# Patient Record
Sex: Male | Born: 1935 | Race: White | Hispanic: No | Marital: Married
Health system: Southern US, Community
[De-identification: ages and names within clinical notes are randomized; demographics above are authoritative.]

---

## 2013-08-06 ENCOUNTER — Inpatient Hospital Stay
Admission: AD | Admit: 2013-08-06 | Discharge: 2013-08-22 | Disposition: E | Payer: Medicare Other | Source: Ambulatory Visit | Attending: Internal Medicine | Admitting: Internal Medicine

## 2013-08-06 DIAGNOSIS — I429 Cardiomyopathy, unspecified: Secondary | ICD-10-CM

## 2013-08-06 DIAGNOSIS — L03115 Cellulitis of right lower limb: Secondary | ICD-10-CM

## 2013-08-06 DIAGNOSIS — R188 Other ascites: Secondary | ICD-10-CM

## 2013-08-06 DIAGNOSIS — E039 Hypothyroidism, unspecified: Secondary | ICD-10-CM

## 2013-08-06 DIAGNOSIS — E119 Type 2 diabetes mellitus without complications: Secondary | ICD-10-CM

## 2013-08-06 DIAGNOSIS — I10 Essential (primary) hypertension: Secondary | ICD-10-CM

## 2013-08-06 DIAGNOSIS — M109 Gout, unspecified: Secondary | ICD-10-CM

## 2013-08-06 DIAGNOSIS — I5082 Biventricular heart failure: Secondary | ICD-10-CM

## 2013-08-06 DIAGNOSIS — Z96659 Presence of unspecified artificial knee joint: Secondary | ICD-10-CM

## 2013-08-06 DIAGNOSIS — J9600 Acute respiratory failure, unspecified whether with hypoxia or hypercapnia: Secondary | ICD-10-CM

## 2013-08-06 DIAGNOSIS — N179 Acute kidney failure, unspecified: Secondary | ICD-10-CM

## 2013-08-06 LAB — CBC WITH DIFFERENTIAL/PLATELET
BASOS ABS: 0 10*3/uL (ref 0.0–0.1)
Basophils Relative: 0 % (ref 0–1)
Eosinophils Absolute: 0.4 10*3/uL (ref 0.0–0.7)
Eosinophils Relative: 1 % (ref 0–5)
HCT: 48.6 % (ref 39.0–52.0)
Hemoglobin: 14.7 g/dL (ref 13.0–17.0)
LYMPHS ABS: 0.7 10*3/uL (ref 0.7–4.0)
Lymphocytes Relative: 2 % — ABNORMAL LOW (ref 12–46)
MCH: 28.8 pg (ref 26.0–34.0)
MCHC: 30.2 g/dL (ref 30.0–36.0)
MCV: 95.3 fL (ref 78.0–100.0)
MONOS PCT: 1 % — AB (ref 3–12)
Monocytes Absolute: 0.4 10*3/uL (ref 0.1–1.0)
Neutro Abs: 33.5 10*3/uL — ABNORMAL HIGH (ref 1.7–7.7)
Neutrophils Relative %: 96 % — ABNORMAL HIGH (ref 43–77)
PLATELETS: 340 10*3/uL (ref 150–400)
RBC: 5.1 MIL/uL (ref 4.22–5.81)
RDW: 19.3 % — AB (ref 11.5–15.5)
WBC: 35 10*3/uL — ABNORMAL HIGH (ref 4.0–10.5)

## 2013-08-06 LAB — COMPREHENSIVE METABOLIC PANEL
ALBUMIN: 3 g/dL — AB (ref 3.5–5.2)
ALT: 5 U/L (ref 0–53)
AST: 9 U/L (ref 0–37)
Alkaline Phosphatase: 126 U/L — ABNORMAL HIGH (ref 39–117)
BILIRUBIN TOTAL: 0.9 mg/dL (ref 0.3–1.2)
BUN: 72 mg/dL — AB (ref 6–23)
CO2: 26 meq/L (ref 19–32)
CREATININE: 2.17 mg/dL — AB (ref 0.50–1.35)
Calcium: 8.7 mg/dL (ref 8.4–10.5)
Chloride: 106 mEq/L (ref 96–112)
GFR calc Af Amer: 30 mL/min — ABNORMAL LOW (ref >90)
GFR calc non Af Amer: 26 mL/min — ABNORMAL LOW (ref >90)
Glucose, Bld: 167 mg/dL — ABNORMAL HIGH (ref 70–99)
Potassium: 5 mEq/L (ref 3.7–5.3)
Sodium: 146 mEq/L (ref 137–147)
Total Protein: 6.1 g/dL (ref 6.0–8.3)

## 2013-08-06 LAB — BLOOD GAS, ARTERIAL
ACID-BASE EXCESS: 0.8 mmol/L (ref 0.0–2.0)
Bicarbonate: 25.9 mEq/L — ABNORMAL HIGH (ref 20.0–24.0)
FIO2: 1 %
O2 SAT: 86.9 %
PATIENT TEMPERATURE: 97.2
TCO2: 27.5 mmol/L (ref 0–100)
pCO2 arterial: 48.2 mmHg — ABNORMAL HIGH (ref 35.0–45.0)
pH, Arterial: 7.346 — ABNORMAL LOW (ref 7.350–7.450)
pO2, Arterial: 50.1 mmHg — ABNORMAL LOW (ref 80.0–100.0)

## 2013-08-07 ENCOUNTER — Other Ambulatory Visit (HOSPITAL_COMMUNITY): Payer: Self-pay

## 2013-08-07 LAB — BLOOD GAS, ARTERIAL
ACID-BASE DEFICIT: 0.1 mmol/L (ref 0.0–2.0)
Acid-base deficit: 0.4 mmol/L (ref 0.0–2.0)
Bicarbonate: 25 mEq/L — ABNORMAL HIGH (ref 20.0–24.0)
Bicarbonate: 25 mEq/L — ABNORMAL HIGH (ref 20.0–24.0)
Delivery systems: POSITIVE
Delivery systems: POSITIVE
EXPIRATORY PAP: 8
Expiratory PAP: 10
FIO2: 1 %
FIO2: 1 %
Inspiratory PAP: 16
Inspiratory PAP: 20
O2 Saturation: 84.4 %
O2 Saturation: 90.4 %
PATIENT TEMPERATURE: 96.3
PH ART: 7.352 (ref 7.350–7.450)
PO2 ART: 53.9 mmHg — AB (ref 80.0–100.0)
Patient temperature: 96.3
TCO2: 26.5 mmol/L (ref 0–100)
TCO2: 26.5 mmol/L (ref 0–100)
pCO2 arterial: 47.6 mmHg — ABNORMAL HIGH (ref 35.0–45.0)
pCO2 arterial: 45.5 mmHg — ABNORMAL HIGH (ref 35.0–45.0)
pH, Arterial: 7.332 — ABNORMAL LOW (ref 7.350–7.450)
pO2, Arterial: 46.9 mmHg — ABNORMAL LOW (ref 80.0–100.0)

## 2013-08-07 LAB — COMPREHENSIVE METABOLIC PANEL
ALBUMIN: 3 g/dL — AB (ref 3.5–5.2)
ALK PHOS: 135 U/L — AB (ref 39–117)
AST: 10 U/L (ref 0–37)
BILIRUBIN TOTAL: 1 mg/dL (ref 0.3–1.2)
BUN: 69 mg/dL — ABNORMAL HIGH (ref 6–23)
CO2: 24 meq/L (ref 19–32)
Calcium: 8.8 mg/dL (ref 8.4–10.5)
Chloride: 108 mEq/L (ref 96–112)
Creatinine, Ser: 2.14 mg/dL — ABNORMAL HIGH (ref 0.50–1.35)
GFR calc Af Amer: 30 mL/min — ABNORMAL LOW (ref >90)
GFR, EST NON AFRICAN AMERICAN: 26 mL/min — AB (ref >90)
Glucose, Bld: 149 mg/dL — ABNORMAL HIGH (ref 70–99)
POTASSIUM: 5.5 meq/L — AB (ref 3.7–5.3)
Sodium: 147 mEq/L (ref 137–147)
Total Protein: 6.2 g/dL (ref 6.0–8.3)

## 2013-08-07 LAB — FOLATE RBC: RBC Folate: 624 ng/mL — ABNORMAL HIGH (ref >280)

## 2013-08-07 LAB — HEMOGLOBIN A1C
Hgb A1c MFr Bld: 6 % — ABNORMAL HIGH (ref <5.7)
Mean Plasma Glucose: 126 mg/dL — ABNORMAL HIGH (ref <117)

## 2013-08-07 LAB — CK TOTAL AND CKMB (NOT AT ARMC)
CK, MB: 2.1 ng/mL (ref 0.3–4.0)
RELATIVE INDEX: INVALID (ref 0.0–2.5)
Total CK: 14 U/L (ref 7–232)

## 2013-08-07 LAB — PROTIME-INR
INR: 1.79 — ABNORMAL HIGH (ref 0.00–1.49)
Prothrombin Time: 20.3 seconds — ABNORMAL HIGH (ref 11.6–15.2)

## 2013-08-07 LAB — PREALBUMIN: Prealbumin: 9.4 mg/dL — ABNORMAL LOW (ref 17.0–34.0)

## 2013-08-07 LAB — PRO B NATRIURETIC PEPTIDE: Pro B Natriuretic peptide (BNP): 40668 pg/mL — ABNORMAL HIGH (ref 0–450)

## 2013-08-07 LAB — TROPONIN I

## 2013-08-07 LAB — T4, FREE: Free T4: 0.89 ng/dL (ref 0.80–1.80)

## 2013-08-07 LAB — MAGNESIUM: MAGNESIUM: 2.1 mg/dL (ref 1.5–2.5)

## 2013-08-07 LAB — FERRITIN: FERRITIN: 119 ng/mL (ref 22–322)

## 2013-08-07 LAB — PHOSPHORUS: PHOSPHORUS: 3.9 mg/dL (ref 2.3–4.6)

## 2013-08-07 LAB — T3: T3 TOTAL: 28.7 ng/dL — AB (ref 80.0–204.0)

## 2013-08-07 LAB — APTT: aPTT: 49 seconds — ABNORMAL HIGH (ref 24–37)

## 2013-08-07 LAB — PROCALCITONIN: Procalcitonin: 0.33 ng/mL

## 2013-08-07 LAB — TSH: TSH: 5.582 u[IU]/mL — ABNORMAL HIGH (ref 0.350–4.500)

## 2013-08-08 ENCOUNTER — Other Ambulatory Visit (HOSPITAL_COMMUNITY): Payer: Self-pay

## 2013-08-08 LAB — MAGNESIUM: Magnesium: 2 mg/dL (ref 1.5–2.5)

## 2013-08-08 LAB — BASIC METABOLIC PANEL
BUN: 73 mg/dL — AB (ref 6–23)
CALCIUM: 8.8 mg/dL (ref 8.4–10.5)
CO2: 24 mEq/L (ref 19–32)
Chloride: 109 mEq/L (ref 96–112)
Creatinine, Ser: 2.29 mg/dL — ABNORMAL HIGH (ref 0.50–1.35)
GFR calc Af Amer: 28 mL/min — ABNORMAL LOW (ref >90)
GFR calc non Af Amer: 24 mL/min — ABNORMAL LOW (ref >90)
GLUCOSE: 158 mg/dL — AB (ref 70–99)
POTASSIUM: 4.9 meq/L (ref 3.7–5.3)
SODIUM: 149 meq/L — AB (ref 137–147)

## 2013-08-08 LAB — CBC
HCT: 43.8 % (ref 39.0–52.0)
Hemoglobin: 13.2 g/dL (ref 13.0–17.0)
MCH: 28.6 pg (ref 26.0–34.0)
MCHC: 30.1 g/dL (ref 30.0–36.0)
MCV: 95 fL (ref 78.0–100.0)
Platelets: 308 10*3/uL (ref 150–400)
RBC: 4.61 MIL/uL (ref 4.22–5.81)
RDW: 19.6 % — AB (ref 11.5–15.5)
WBC: 35.3 10*3/uL — AB (ref 4.0–10.5)

## 2013-08-08 LAB — PROTIME-INR
INR: 1.87 — AB (ref 0.00–1.49)
PROTHROMBIN TIME: 21 s — AB (ref 11.6–15.2)

## 2013-08-08 LAB — SAVE SMEAR

## 2013-08-08 LAB — APTT: aPTT: 51 seconds — ABNORMAL HIGH (ref 24–37)

## 2013-08-09 ENCOUNTER — Other Ambulatory Visit (HOSPITAL_COMMUNITY): Payer: Self-pay

## 2013-08-10 ENCOUNTER — Other Ambulatory Visit (HOSPITAL_COMMUNITY): Payer: Self-pay

## 2013-08-10 DIAGNOSIS — L03115 Cellulitis of right lower limb: Secondary | ICD-10-CM

## 2013-08-10 DIAGNOSIS — I5082 Biventricular heart failure: Secondary | ICD-10-CM

## 2013-08-10 DIAGNOSIS — E039 Hypothyroidism, unspecified: Secondary | ICD-10-CM

## 2013-08-10 DIAGNOSIS — J9600 Acute respiratory failure, unspecified whether with hypoxia or hypercapnia: Secondary | ICD-10-CM

## 2013-08-10 DIAGNOSIS — E119 Type 2 diabetes mellitus without complications: Secondary | ICD-10-CM

## 2013-08-10 DIAGNOSIS — N179 Acute kidney failure, unspecified: Secondary | ICD-10-CM

## 2013-08-10 DIAGNOSIS — I10 Essential (primary) hypertension: Secondary | ICD-10-CM

## 2013-08-10 DIAGNOSIS — M109 Gout, unspecified: Secondary | ICD-10-CM

## 2013-08-10 DIAGNOSIS — Z96659 Presence of unspecified artificial knee joint: Secondary | ICD-10-CM

## 2013-08-10 DIAGNOSIS — R188 Other ascites: Secondary | ICD-10-CM

## 2013-08-10 DIAGNOSIS — I429 Cardiomyopathy, unspecified: Secondary | ICD-10-CM

## 2013-08-10 LAB — BLOOD GAS, ARTERIAL
Acid-base deficit: 4.1 mmol/L — ABNORMAL HIGH (ref 0.0–2.0)
Acid-base deficit: 6.3 mmol/L — ABNORMAL HIGH (ref 0.0–2.0)
Acid-base deficit: 10.5 mmol/L — ABNORMAL HIGH (ref 0.0–2.0)
Bicarbonate: 21 meq/L (ref 20.0–24.0)
Bicarbonate: 19.2 meq/L — ABNORMAL LOW (ref 20.0–24.0)
Bicarbonate: 16.6 mEq/L — ABNORMAL LOW (ref 20.0–24.0)
Delivery systems: POSITIVE
Delivery systems: POSITIVE
Expiratory PAP: 10
Expiratory PAP: 10
FIO2: 1 %
FIO2: 1 %
FIO2: 1 %
Inspiratory PAP: 20
Inspiratory PAP: 20
MECHVT: 500 mL
O2 SAT: 75.3 %
O2 Saturation: 70.8 %
O2 Saturation: 94.3 %
PATIENT TEMPERATURE: 98.6
PEEP: 5 cmH2O
PH ART: 7.162 — AB (ref 7.350–7.450)
Patient temperature: 98.6
Patient temperature: 98.6
RATE: 14 {breaths}/min
RATE: 14 resp/min
TCO2: 22.4 mmol/L (ref 0–100)
TCO2: 20.6 mmol/L (ref 0–100)
TCO2: 18.1 mmol/L (ref 0–100)
pCO2 arterial: 42.7 mmHg (ref 35.0–45.0)
pCO2 arterial: 42.6 mmHg (ref 35.0–45.0)
pCO2 arterial: 48.3 mmHg — ABNORMAL HIGH (ref 35.0–45.0)
pH, Arterial: 7.314 — ABNORMAL LOW (ref 7.350–7.450)
pH, Arterial: 7.277 — ABNORMAL LOW (ref 7.350–7.450)
pO2, Arterial: 41.2 mmHg — ABNORMAL LOW (ref 80.0–100.0)
pO2, Arterial: 80.6 mmHg (ref 80.0–100.0)
pO2, Arterial: 51.8 mmHg — ABNORMAL LOW (ref 80.0–100.0)

## 2013-08-10 LAB — CBC
HCT: 50.1 % (ref 39.0–52.0)
Hemoglobin: 14.8 g/dL (ref 13.0–17.0)
MCH: 28.4 pg (ref 26.0–34.0)
MCHC: 29.5 g/dL — AB (ref 30.0–36.0)
MCV: 96 fL (ref 78.0–100.0)
PLATELETS: 435 10*3/uL — AB (ref 150–400)
RBC: 5.22 MIL/uL (ref 4.22–5.81)
RDW: 19.4 % — AB (ref 11.5–15.5)
WBC: 57.2 10*3/uL (ref 4.0–10.5)

## 2013-08-10 LAB — PROCALCITONIN: Procalcitonin: 0.74 ng/mL

## 2013-08-10 LAB — CORTISOL: Cortisol, Plasma: 26.1 ug/dL

## 2013-08-10 LAB — MAGNESIUM: MAGNESIUM: 2.3 mg/dL (ref 1.5–2.5)

## 2013-08-10 LAB — LACTIC ACID, PLASMA: LACTIC ACID, VENOUS: 5.1 mmol/L — AB (ref 0.5–2.2)

## 2013-08-11 LAB — BASIC METABOLIC PANEL
BUN: 91 mg/dL — ABNORMAL HIGH (ref 6–23)
CALCIUM: 9 mg/dL (ref 8.4–10.5)
CO2: 19 mEq/L (ref 19–32)
Chloride: 108 mEq/L (ref 96–112)
Creatinine, Ser: 3.44 mg/dL — ABNORMAL HIGH (ref 0.50–1.35)
GFR calc non Af Amer: 15 mL/min — ABNORMAL LOW (ref >90)
GFR, EST AFRICAN AMERICAN: 17 mL/min — AB (ref >90)
Glucose, Bld: 146 mg/dL — ABNORMAL HIGH (ref 70–99)
POTASSIUM: 5.6 meq/L — AB (ref 3.7–5.3)
SODIUM: 149 meq/L — AB (ref 137–147)

## 2013-08-22 NOTE — Procedures (Signed)
Intubation Procedure Note Phillip Weaver [id number] 01/13/1924  Procedure: Intubation Indications: Respiratory insufficiency  Procedure Details Consent: Risks of procedure as well as the alternatives and risks of each were explained to the (patient/caregiver).  Consent for procedure obtained. Time Out: Verified patient identification, verified procedure, site/side was marked, verified correct patient position, special equipment/implants available, medications/allergies/relevent history reviewed, required imaging and test results available.  Performed  MAC and 4 Medications:  Fentanyl 50 mcg Etomidate 20 mg Versed 4 mg NMB    Evaluation Hemodynamic Status: Persistent hypotension treated with fluid; O2 sats: currently acceptable Patient's Current Condition: stable Complications: No apparent complications Patient did tolerate procedure well. Chest X-ray ordered to verify placement.  CXR: pending.   Brett CanalesSteve Minor ACNP [facility] Pager [phone] till 3 pm If no answer page [phone] 08/21/2013, 9:14 AM  I was present for procedure.  Coralyn HellingVineet Docia Klar, MD [facility] 08/09/2013, 11:20 AM Pager:  [phone] After 3pm call: [phone]

## 2013-08-22 NOTE — Consult Note (Signed)
Name: Phillip Weaver MRN: [id number] DOB: 02/08/1924    ADMISSION DATE:  08/08/2013 CONSULTATION DATE: 1-20  [facility]  CHIEF COMPLAINT:  resp failure  BRIEF PATIENT DESCRIPTION:  80 yo wm with a plethora of health issues and in and out of hospitals x 2 months with continue declined in state of health  SIGNIFICANT EVENTS / STUDIES:  1-20 OTT per Kaedynce Tapp/Minor  LINES / TUBES: RT PICC>> Ott 1-20 >>  CULTURES:   ANTIBIOTICS: Clinda 1-16>> Pip-tazo 1-16>>  HISTORY OF PRESENT ILLNESS:   80 yo wm with a plethora of health issues and in and out of hospitals x 2 months with continue declined in state of health. Admitted to [facility] 1-16 -2015 with progressive CHF despite aggressive diuresis, along with rising creatine and acute on chronic resp failure. 1-20 PCM called to bedside for hypoxia despite NIMVS, hypotension and urgent intubation performed per PCCM.Note he had recent paracentesis for ascites. He and wife request full code status and therefore intubated.     PAST MEDICAL HISTORY :    Biventricular CHF (congestive heart failure)   HTN (hypertension)   Diabetes   Hypothyroid   Acute respiratory failure   Acute renal failure   Cellulitis of leg, right   Gout   Ascites   Cardiomyopathy   S/P total knee replacement  Medications reviewed  Allergies: Lortab Statins  FAMILY HISTORY: insignificant  SOCIAL HISTORY: reviewed  REVIEW OF SYSTEMS: unable to obtain  SUBJECTIVE:   VITAL SIGNS:   HEMODYNAMICS:   VENTILATOR SETTINGS:   INTAKE / OUTPUT: Intake/Output   None     PHYSICAL EXAMINATION: General:  Frail elderly combative wm. Neuro:  Confused but moves all ext x 4 HEENT:  No jvd, PERL Cardiovascular: HSIR Lungs:  Decreased air movement Abdomen:  Obese, + fluid+ bs Musculoskeletal:  Intact Skin:  Lower ext edema  LABS:  CBC  Recent Labs Lab 07/24/2013 1950 08/08/13 0748 11/30/2013 0626  WBC 35.0* 35.3* 57.2*  HGB 14.7  13.2 14.8  HCT 48.6 43.8 50.1  PLT 340 308 435*   Coag's  Recent Labs Lab 08/07/13 0555 08/08/13 0748  APTT 49* 51*  INR 1.79* 1.87*   BMET  Recent Labs Lab 08/07/13 0555 08/08/13 0748 11/30/2013 0626  NA 147 149* 149*  K 5.5* 4.9 5.6*  CL 108 109 108  CO2 24 24 19   BUN 69* 73* 91*  CREATININE 2.14* 2.29* 3.44*  GLUCOSE 149* 158* 146*   Electrolytes  Recent Labs Lab 08/07/13 0555 08/08/13 0748 11/30/2013 0626  CALCIUM 8.8 8.8 9.0  MG 2.1 2.0 2.3  PHOS 3.9  --   --    Sepsis Markers  Recent Labs Lab 08/07/13 0555  PROCALCITON 0.33   ABG  Recent Labs Lab 08/07/13 1245 11/30/2013 0508 11/30/2013 0815  PHART 7.352 7.314* 7.277*  PCO2ART 45.5* 42.7 42.6  PO2ART 53.9* 41.2* 80.6   Liver Enzymes  Recent Labs Lab 08/03/2013 1950 08/07/13 0555  AST 9 10  ALT 5 <5  ALKPHOS 126* 135*  BILITOT 0.9 1.0  ALBUMIN 3.0* 3.0*   Cardiac Enzymes  Recent Labs Lab 08/07/13 0555  TROPONINI <0.30  PROBNP 40668.0*   Glucose No results found for this basename: GLUCAP,  in the last 168 hours  Imaging Dg Chest Port 1 View  08/09/2013   CLINICAL DATA:  PICC line placed  EXAM: PORTABLE CHEST - 1 VIEW  COMPARISON:  August 07, 2013  FINDINGS: The heart size and mediastinal  contours are stable. The heart size is enlarged. A right subclavian central venous line is identified with distal tip probably in the right atrium. There is no pleural line to suggest pneumothorax. There is pulmonary edema. There are probable bilateral pleural effusions. There is consolidation of bilateral lung bases. The visualized skeletal structures are stable.  IMPRESSION: Right subclavian central venous line with distal tip in the right atrium. There is no pleural line to suggest pneumothorax. Severe pulmonary edema with bilateral pleural effusions.   Electronically Signed   By: Sherian Rein M.D.   On: 08/09/2013 12:02   Dg Abd Portable 1v  08/09/2013   CLINICAL DATA:  Verify NG tube placement   EXAM: PORTABLE ABDOMEN - 1 VIEW  COMPARISON:  August 08, 2013  FINDINGS: A feeding tube is identified with distal tip in the distal stomach. Visualized bowel gas pattern is normal.  IMPRESSION: Feeding tube identified distal tip in the distal stomach.   Electronically Signed   By: Sherian Rein M.D.   On: 08/09/2013 14:47   Dg Abd Portable 1v  08/08/2013   CLINICAL DATA:  Evaluate feeding tube placement.  EXAM: PORTABLE ABDOMEN - 1 VIEW  COMPARISON:  08/08/2013  FINDINGS: Feeding tube is in the region of the distal stomach. Nonspecific bowel gas pattern. Persistent densities at the left lung base.  IMPRESSION: Feeding tube tip in the region of the stomach distal body or antrum.  Persistent densities at the left lung base.   Electronically Signed   By: Richarda Overlie M.D.   On: 08/08/2013 14:56   Dg Abd Portable 1v  08/08/2013   CLINICAL DATA:  NG tube placement  EXAM: PORTABLE ABDOMEN - 1 VIEW  COMPARISON:  None.  FINDINGS: NG tube projects in the left upper quadrant.  IMPRESSION: NG tube projects over the anticipated position of the body of the stomach   Electronically Signed   By: Esperanza Heir M.D.   On: 08/08/2013 12:03   Lab Results  Component Value Date   CREATININE 3.44* August 26, 2013   CREATININE 2.29* 08/08/2013   CREATININE 2.14* 08/07/2013     CXR: see above  ASSESSMENT  Active Problems:   Biventricular CHF (congestive heart failure)   HTN (hypertension)   Diabetes   Hypothyroid   Acute respiratory failure   Acute renal failure   Cellulitis of leg, right   Gout   Ascites   Cardiomyopathy   S/P total knee replacement  Plan: Intubate and vent bundle, adjust as needed. Check abg for base line Stop diuresis for now due to hypotension. Consider aline for accurate bps. SSI per [facility] Renal per [facility], ? Renal consult and HD Cardiomyopathy per [facility] Abx per [facility] Thyroid replacement per [facility]   [facility] Minor ACNP [facility] Pager [phone] till 3 pm If no answer page [phone] 26-Aug-2013,  9:28 AM   Reviewed above, examined pt, and agree with assessment/plan.  80 yo male with acute on chronic resp failure from HCAP, acute on chronic CHF, pulmonary HTN.  He is hypotension, and not improving with NiPPV.  Proceeded with intubation.  BP management per primary team.  Abx per primary team.  I am concerned that he will not be able to be liberated from ventilator.  Will need to have further discussions with family about goals of care depending on his progress.  D/w Dr. Sharyon Medicus.  CC time 40 minutes.  Coralyn Helling, MD [facility] Pulmonary/Critical Care 08-26-2013, 11:23 AM Pager:  [phone] After 3pm call: [phone]

## 2013-08-22 DEATH — deceased

## 2014-10-23 IMAGING — DX DG CHEST 1V PORT
1 series · 1 of 1 positions shown · non-contrast
Comparison: August 07, 2013

CLINICAL DATA: PICC line placed

EXAM:
PORTABLE CHEST - 1 VIEW

[portable]
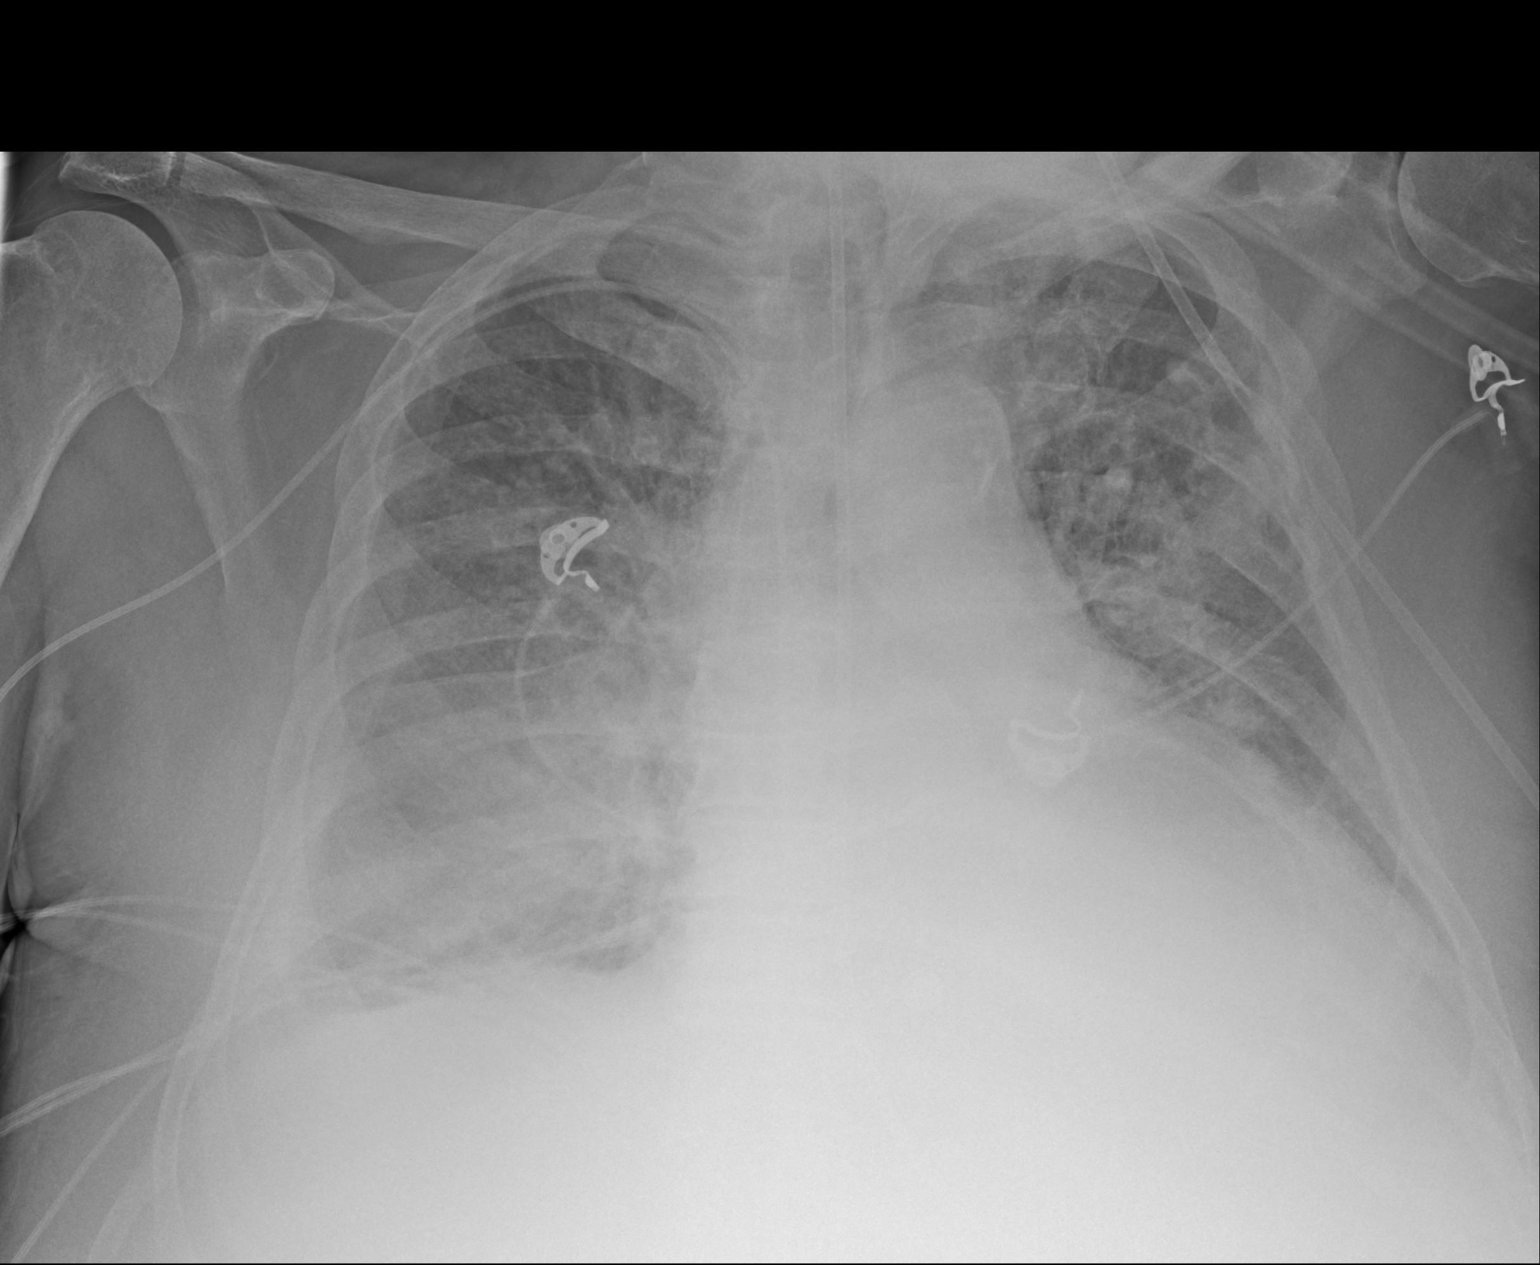

[1 of 1 positions shown; findings below may reference images not displayed]

FINDINGS: The heart size and mediastinal contours are stable. The heart size
is enlarged. A right subclavian central venous line is identified
with distal tip probably in the right atrium. There is no pleural
line to suggest pneumothorax. There is pulmonary edema. There are
probable bilateral pleural effusions. There is consolidation of
bilateral lung bases. The visualized skeletal structures are stable.
IMPRESSION: Right subclavian central venous line with distal tip in the right
atrium. There is no pleural line to suggest pneumothorax. Severe
pulmonary edema with bilateral pleural effusions.

## 2014-10-24 IMAGING — DX DG CHEST 1V PORT
1 series · 1 of 1 positions shown · non-contrast
Comparison: 08/09/2013

CLINICAL DATA: Status post intubation.

EXAM:
PORTABLE CHEST - 1 VIEW

[portable]
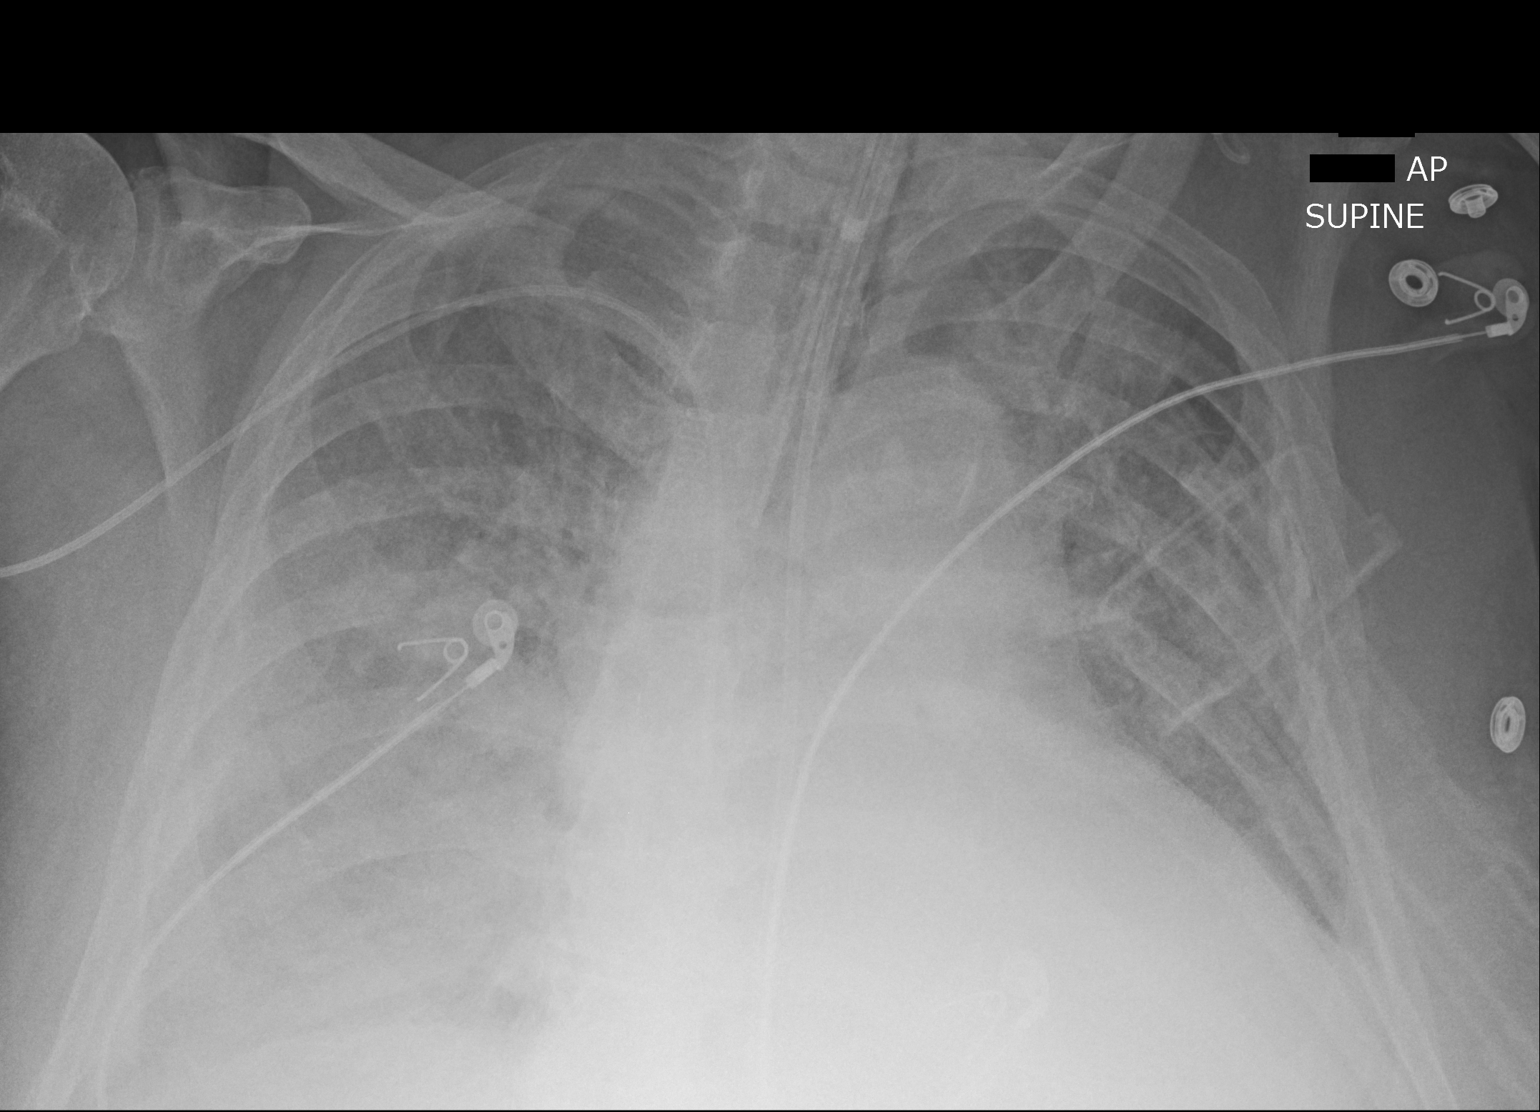

[1 of 1 positions shown; findings below may reference images not displayed]

FINDINGS: An endotracheal tube has been placed. The carina is indistinct, but
the endotracheal tube tip is estimated to be 1.8 cm above the
carina. A feeding tube follows the course of the esophagus seen
continues below the edge of the image. Right upper extremity PICC
projects over the distal superior vena cava.

Patient rotated to the left and the extreme left lung base is
excluded from the image. Cardiomegaly is stable. There is
atherosclerotic calcification of the thoracic aortic arch.

Diffuse bilateral airspace opacities suggest pulmonary edema. Left
basilar opacity could reflect atelectasis. Posteriorly layering
pleural effusions cannot be excluded but are not definite. Negative
for pneumothorax. Pleural calcifications are seen on the left.
IMPRESSION: 1. Endotracheal tube estimated to be 1.8 cm above the carina study:
The carina it is indistinct in difficult to visualize however.
2. No significant change in moderate congestive heart failure
pattern.
3. Left pleural calcifications visible. Question any prior history
of asbestos exposure.
